# Patient Record
Sex: Female | Born: 1995 | Race: White | Hispanic: No | Marital: Single | Smoking: Never smoker
Health system: Southern US, Community
[De-identification: ages and names within clinical notes are randomized; demographics above are authoritative.]

## PROBLEM LIST (undated history)

## (undated) DIAGNOSIS — R5383 Other fatigue: Secondary | ICD-10-CM

## (undated) DIAGNOSIS — E063 Autoimmune thyroiditis: Secondary | ICD-10-CM

## (undated) DIAGNOSIS — E039 Hypothyroidism, unspecified: Secondary | ICD-10-CM

## (undated) DIAGNOSIS — H6090 Unspecified otitis externa, unspecified ear: Secondary | ICD-10-CM

## (undated) DIAGNOSIS — E0590 Thyrotoxicosis, unspecified without thyrotoxic crisis or storm: Secondary | ICD-10-CM

## (undated) DIAGNOSIS — L709 Acne, unspecified: Secondary | ICD-10-CM

## (undated) DIAGNOSIS — T7840XA Allergy, unspecified, initial encounter: Secondary | ICD-10-CM

## (undated) DIAGNOSIS — E049 Nontoxic goiter, unspecified: Secondary | ICD-10-CM

## (undated) HISTORY — DX: Autoimmune thyroiditis: E06.3

## (undated) HISTORY — DX: Unspecified otitis externa, unspecified ear: H60.90

## (undated) HISTORY — DX: Hypothyroidism, unspecified: E03.9

## (undated) HISTORY — DX: Other fatigue: R53.83

## (undated) HISTORY — PX: TONSILLECTOMY: SUR1361

## (undated) HISTORY — DX: Allergy, unspecified, initial encounter: T78.40XA

## (undated) HISTORY — PX: TYMPANOSTOMY TUBE PLACEMENT: SHX32

## (undated) HISTORY — DX: Acne, unspecified: L70.9

## (undated) HISTORY — DX: Nontoxic goiter, unspecified: E04.9

## (undated) HISTORY — DX: Thyrotoxicosis, unspecified without thyrotoxic crisis or storm: E05.90

---
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
## 2014-07-07 ENCOUNTER — Telehealth: Payer: Self-pay | Admitting: Internal Medicine

## 2014-07-07 ENCOUNTER — Telehealth: Payer: Self-pay | Admitting: Family Medicine

## 2014-07-07 ENCOUNTER — Telehealth: Payer: Self-pay | Admitting: *Deleted

## 2014-07-07 NOTE — Telephone Encounter (Signed)
Called pt's mother, Marcelino DusterMichelle, lvm to clarify the fax number.

## 2014-07-07 NOTE — Telephone Encounter (Signed)
Called pt's mother, Marcelino DusterMichelle at [phone] and advised her that Dr Elvera LennoxGherghe did not receive any recent labs. She got our fax number and will be faxing them to Dr Elvera LennoxGherghe to review.

## 2014-07-07 NOTE — Telephone Encounter (Signed)
Vickie Mata, can you please check with her Re: Qs?

## 2014-07-07 NOTE — Telephone Encounter (Signed)
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 Last seen 04/26/2013  She is seeing doctor on campus.  Mother would like to see if Dr. Fabian SharpPanosh has any comments or recommendations.  Please advise.  Thanks!

## 2014-07-07 NOTE — Telephone Encounter (Signed)
Pt's mother called back and clarified the fax number for us to send lab results to Dr Renee Harderohoon, #[phone]. She also wanted to know if Dr Elvera LennoxGherghe received any labs from pt recently and has she had the opportunity to review them if so? Please advise.

## 2014-07-07 NOTE — Telephone Encounter (Signed)
I would be happy to review any records  Last time she had high strep antibodies  But can have many causes .    if it would help then she can give release  To  Review our  records and labs related to her last visit of E nodosdum

## 2014-07-07 NOTE — Telephone Encounter (Signed)
Patient mom called, she would like to talk to you about her daughter lab orders call mom, Marcelino DusterMichelle [phone]

## 2014-07-07 NOTE — Telephone Encounter (Signed)
They requested labs to be faxed to dr at [phone]. Done.

## 2014-07-07 NOTE — Telephone Encounter (Signed)
Spoke to LynnMichelle.  She will have Rayfield CitizenCaroline stop by campus clinic to sign a DPR and will have records sent here for WP's review.  Gave her the fax # of [phone] that will come directly to me.

## 2014-07-07 NOTE — Telephone Encounter (Signed)
No, I did not receive any recent labs.Marland Kitchen..Marland Kitchen

## 2014-07-08 NOTE — Telephone Encounter (Signed)
Office note received and placed on WP's desk. 

## 2014-07-11 NOTE — Telephone Encounter (Signed)
I reviewed  The notes received . I agree with her hyper thyroid diagnosis and this could be the cause of her skin changes  Either EN or  Pretibial myxedema that can see in thyroid disease. tsh .06 free t3 7.23 and free t4 3.91  Agree with seeing  Endocrinologist  Up at school . For evaluation and treatment  Recommendations  will also  forward this to dr Elvera LennoxGherghe  Please send them our last  Labs and thryoid tests from December 2014 and on  Thanks

## 2014-07-11 NOTE — Telephone Encounter (Signed)
Patient's mother notified.  She will call back with Rayfield CitizenCaroline makes an appt with endo and we will send lab results.

## 2014-07-12 ENCOUNTER — Telehealth: Payer: Self-pay | Admitting: Internal Medicine

## 2014-07-12 ENCOUNTER — Encounter: Payer: Self-pay | Admitting: Internal Medicine

## 2014-07-12 MED ORDER — ATENOLOL 25 MG PO TABS: 25.0000 mg | ORAL_TABLET | Freq: Every day | ORAL | Status: DC

## 2014-07-12 NOTE — Telephone Encounter (Signed)
Rx completed and faxed to [phone].

## 2014-07-12 NOTE — Progress Notes (Signed)
[facility] [place] [place].  TSH 0.06 (0.27-4.2)  Free T4 3.91 (0.8-1.8)  Free T3 7.23 (2-4)  ESR 2 I think at this point we need an uptake and scan before making further decisions for treatment.   I called and discussed with her mother and the patient is trying to establish care with an endocrinologist in [place], however, the first appointment that she was given was May. The [facility]. ordered a thyroid uptake and scan in the thyroid ultrasound and the results of these are pending.  I asked the mom to give me the name of the pharmacy in [place] so we can send in a beta blocker, since the patient complains of palpitations, and not being able to sleep. She will call me back with this information. We can send atenolol 25 mg at bedtime. We might need to increase to twice a day in the future. Discussed mechanism of action and possible side effects.

## 2014-07-12 NOTE — Telephone Encounter (Signed)
Below is the phone number and fax number for the [facility]. Please advise if I need to order rx for atenolol.

## 2014-07-12 NOTE — Telephone Encounter (Signed)
Yes, please, per my last message

## 2014-07-12 NOTE — Telephone Encounter (Signed)
[facility] # [phone] fax # [phone]  If there are any questions mom's cell # is [phone]

## 2014-07-18 ENCOUNTER — Encounter: Payer: Self-pay | Admitting: Internal Medicine

## 2014-07-18 DIAGNOSIS — E063 Autoimmune thyroiditis: Secondary | ICD-10-CM

## 2014-07-18 NOTE — Progress Notes (Signed)
[facility] [place] from 07[place]12/2014: - TPO antibodies >1000 (0-35) - ATA 65.9 (0-40)  Also received the results of her thyroid ultrasound from 07/14/2014:  Right lobe: Volume 12.1 mL. Increased vascularity throughout the right thyroid lobe. There is a heterogeneous appearance to the parenchyma diffusely. No definite focal nodules identified.  Left lobe: The left lobe is also diffusely heterogeneous. There is a lesion along the inferior aspect of the left thyroid lobe measuring 1.2 x 0.7 x 0.9 cm. This does not definitely contain internal blood flow. It may represent a nodule within the gland, although, it could also represent a parathyroid lesion. Left thyroid lobe volume is 11.2 mL. The left thyroid lobe is also hypervascular.  Isthmus: Thickness of 0.64 cm. No nodules. Impression: 1. Enlarged, heterogeneous, and hypervascular thyroid. This could reflect thyroiditis related to patient's known Hashimoto's disease. 2. Possible left thyroid nodule as discussed above. This could potentially be just outside the thyroid and represent a parathyroid lesion. Please correlate clinically.

## 2014-07-26 ENCOUNTER — Telehealth: Payer: Self-pay | Admitting: Internal Medicine

## 2014-07-26 NOTE — Telephone Encounter (Signed)
MOTHER WANTS TO KNOW THE RESULTS OF UPTAKE SCAN

## 2014-07-26 NOTE — Telephone Encounter (Signed)
Please read message below and advise.  

## 2014-07-26 NOTE — Telephone Encounter (Signed)
Yes, me too. I only got the result of the ultrasound >> that shows increased vascularity, but not uptake an scan yet.Marland Kitchen..Marland Kitchen

## 2014-07-27 ENCOUNTER — Encounter: Payer: Self-pay | Admitting: Internal Medicine

## 2014-07-27 NOTE — Progress Notes (Signed)
Received results of the thyroid uptake and [facility] [place]:  The 4 and 24-hour uptake values are mildly elevated at 37 and 49%, respectively. The findings would be consistent with hyperthyroidism. The right and left lobes of the thyroid are upper normal in size. No discrete nodules are seen.  The normal range is 10-30%.

## 2014-08-05 ENCOUNTER — Encounter: Payer: Self-pay | Admitting: *Deleted

## 2014-10-07 ENCOUNTER — Telehealth: Payer: Self-pay | Admitting: Internal Medicine

## 2014-10-07 IMAGING — CR DG CHEST 2V
2 series · 2 of 2 positions shown · non-contrast
Comparison: None

CLINICAL DATA: Sore throat for 4 days, history asthma

EXAM:
CHEST  2 VIEW

[view not recorded (1 of 2)]
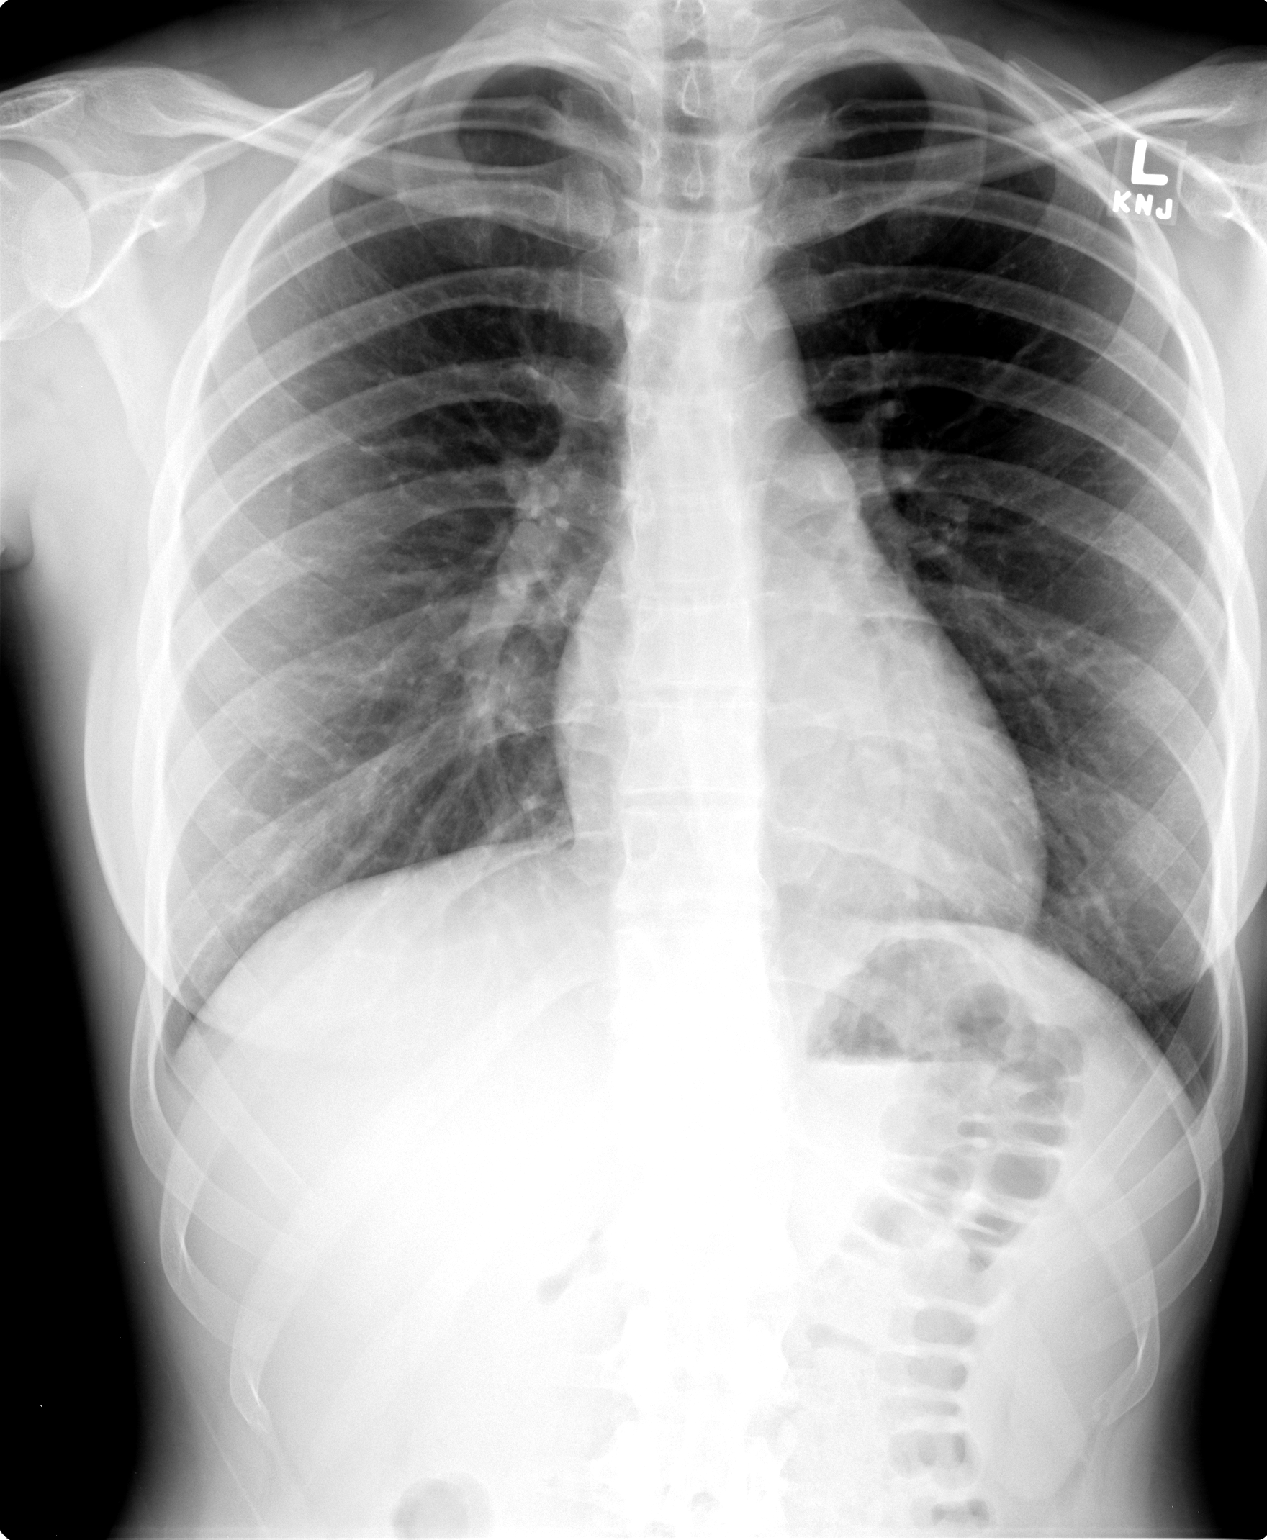

[view not recorded (2 of 2)]
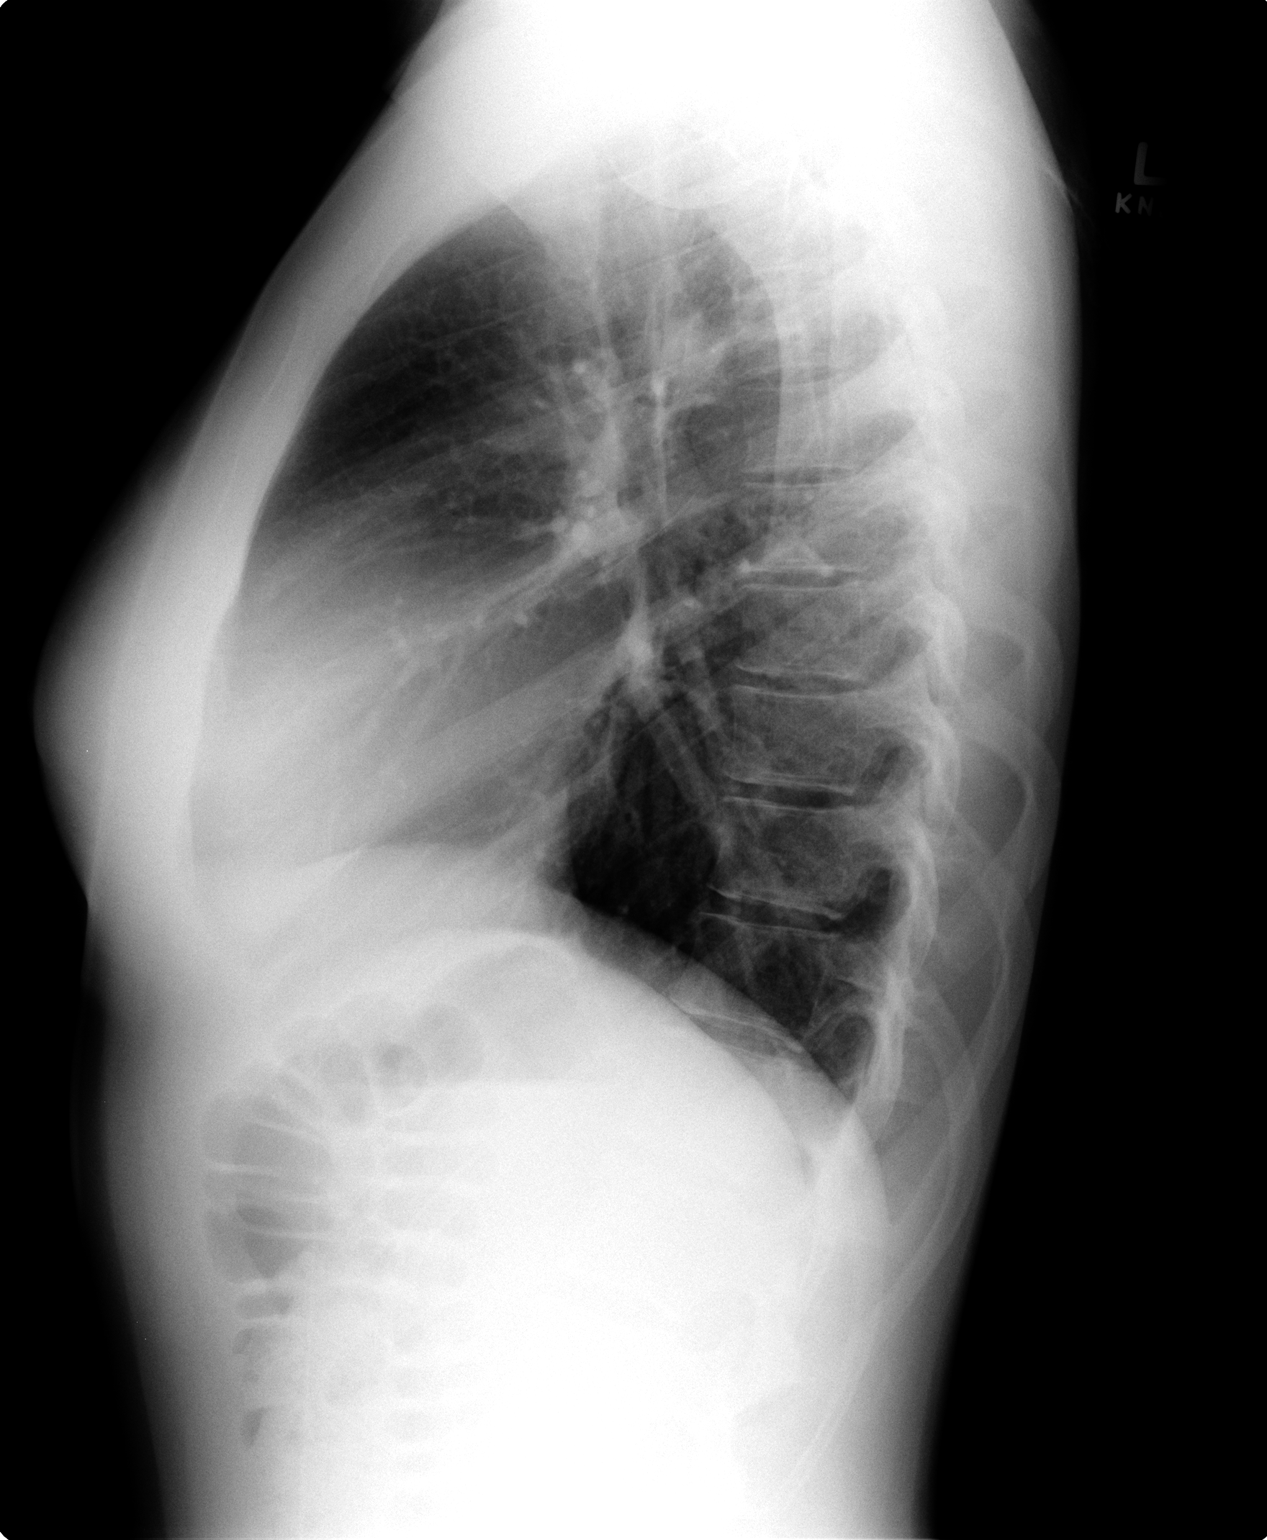

[2 of 2 positions shown; findings below may reference images not displayed]

FINDINGS: Normal heart size, mediastinal contours, and pulmonary vascularity.

Lungs clear.

Bones unremarkable.

No pneumothorax.
IMPRESSION: Normal exam.

## 2014-10-07 NOTE — Telephone Encounter (Signed)
Left a message informing her that I am leaving for the day.  Was not able to tell her that [facility] for her.

## 2014-10-07 NOTE — Telephone Encounter (Signed)
LM for the pt to return my call. 

## 2014-10-07 NOTE — Telephone Encounter (Signed)
[place] traveling to [place] on Monday (for school) and lost her prescription strength anti- diarrhea med. Would like to know if dr Fabian Sharppanosh would send her in another one for the trip. [facility]  Pt not seen since 04/26/2013 Pt also asked if she would refill her Norgestimate-Ethinyl Estradiol Triphasic 0.18/0.215/0.25 MG-35 MCG tablet   Advised pt probably not since she had not been seen, but would ask.

## 2014-10-25 ENCOUNTER — Ambulatory Visit (INDEPENDENT_AMBULATORY_CARE_PROVIDER_SITE_OTHER): Payer: BLUE CROSS/BLUE SHIELD | Admitting: Internal Medicine

## 2014-10-25 ENCOUNTER — Encounter: Payer: Self-pay | Admitting: Internal Medicine

## 2014-10-25 VITALS — BP 118/80 | Temp 98.6°F | Wt 157.0 lb

## 2014-10-25 DIAGNOSIS — Z3041 Encounter for surveillance of contraceptive pills: Secondary | ICD-10-CM

## 2014-10-25 DIAGNOSIS — L52 Erythema nodosum: Secondary | ICD-10-CM

## 2014-10-25 DIAGNOSIS — Z79899 Other long term (current) drug therapy: Secondary | ICD-10-CM | POA: Diagnosis not present

## 2014-10-25 DIAGNOSIS — E063 Autoimmune thyroiditis: Secondary | ICD-10-CM | POA: Diagnosis not present

## 2014-10-25 MED ORDER — NORGESTIM-ETH ESTRAD TRIPHASIC 0.18/0.215/0.25 MG-35 MCG PO TABS: 1.0000 | ORAL_TABLET | Freq: Every day | ORAL | Status: DC

## 2014-10-25 NOTE — Patient Instructions (Signed)
Sounds like erythema nodosum . Take pictures and check with derm also. Many causes  possible  Uncertain cause  OCPS thyroid could do this.  Fu with dermatologist  And  Dr Elvera LennoxGherghe about thryoid  .

## 2014-10-25 NOTE — Progress Notes (Signed)
Pre visit review using our clinic review tool, if applicable. No additional management support is needed unless otherwise documented below in the visit note.  Chief Complaint  Patient presents with  . Painful lumps on left shin    fill ocps ? tsh    HPI: Patient Vickie Mata  comes in today for SDA for  new problem evaluation. 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 Last seen dec 2014 ... Comes in to check  About nodules recurring in her lower extremities over the shins. See past notes and if can  rx meds and check thyroid .. 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  [place] in [place]  This summer .hard to get to  appts m- fri.  So comes today  Recently had some nodules left lower extremity not discolored at this point and not particularly painful but asks about them again. No fevers or arthritis. Has been on OCPs from her dermatologist but can't get to appointment until the end of the summer can we refill her medicine no side effects noted no history of blood clots.  She is also due for yearly check with endocrinology because of her autoimmune thyroiditis last TSH was about a year ago. She is not yet on the schedule for the endocrinologist. ROS: See pertinent positives and negatives per HPI. No cp sob  Just returned from 12 days [place]  No exposures . No new meds  Out of ocps for 2 weeks  Generally feels well no unusual weight loss sweats fevers new rashes otherwise. Her shortness of breath chest pain.  Past Medical History  Diagnosis Date  . Allergy   . Acne   . Hashimoto's thyroiditis   . Asthma   . Hyperthyroidism   . Hypothyroidism   . Goiter   . Fatigue   . Otitis externa 12/09/2011    Family History  Problem Relation Age of Onset  . Thyroid disease Mother     Borderline TFTs  . Diabetes Maternal Grandfather   . Diabetes Paternal Grandfather   . Cancer Neg Hx   . Heart disease Neg Hx   . Stroke Neg Hx      History   Social History  . Marital Status: Single    Spouse Name: N/A  . Number of Children: N/A  . Years of Education: N/A   Social History Main Topics  . Smoking status: Never Smoker   . Smokeless tobacco: Not on file  . Alcohol Use: No  . Drug Use: No  . Sexual Activity: Not on file   Other Topics Concern  . None   Social History Narrative   HH of 6 no ETS   Swims   [employer] and track   [place] originally    [place]   To attend [employer] in the fall    Outpatient Prescriptions Prior to Visit  Medication Sig Dispense Refill  . Norgestimate-Ethinyl Estradiol Triphasic 0.18/0.215/0.25 MG-35 MCG tablet Take 1 tablet by mouth daily.    Marland Kitchen atenolol (TENORMIN) 25 MG tablet Take 1 tablet (25 mg total) by mouth at bedtime. (Patient not taking: Reported on 10/25/2014) 90 tablet 1   No facility-administered medications prior to visit.     EXAM:  BP 118/80 mmHg  Temp(Src) 98.6 F (37 C) (Oral)  Wt 157 lb (71.215 kg)  Body mass index is 23.02 kg/(m^2). Repeat bp 118/80 GENERAL: vitals reviewed and listed above, alert,  oriented, appears well hydrated and in no acute distress HEENT: atraumatic, conjunctiva  clear, no obvious abnormalities on inspection of external nose and ears  NECK: no obvious masses on inspection palpation  MS: moves all extremities without noticeable focal  Abnormality Skin palpable nodules 2  anterior shin left non tender  An no coloration  About 2 cm no other acute finding   PSYCH: pleasant and cooperative, no obvious depression or anxiety  ASSESSMENT AND PLAN:  Discussed the following assessment and plan:  Erythema nodosum - prob by hx and description not acute at this time needs thyroid fu .   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 12 14  She's had thyroid disease at that time. Apparently has had flareups in the past year once or twice also. Discussed differential diagnosis no alarm features today if flareup of her leg symptoms we can have her dermatologist see her recheck here. -Patient advised to return or notify health care team  if symptoms worsen ,persist or new concerns arise.  Patient Instructions  Sounds like erythema nodosum . Take pictures and check with derm also. Many causes  possible  Uncertain cause  OCPS thyroid could do this.  Fu with dermatologist  And  Dr Elvera LennoxGherghe about thryoid  .      Neta MendsWanda K. Panosh M.D.

## 2015-01-11 ENCOUNTER — Telehealth: Payer: Self-pay | Admitting: Internal Medicine

## 2015-01-11 NOTE — Telephone Encounter (Signed)
Patient asked if her lab order can be sent to [facility] office, phone # [phone]  Fax # [phone]

## 2015-01-12 ENCOUNTER — Telehealth: Payer: Self-pay | Admitting: Internal Medicine

## 2015-01-12 ENCOUNTER — Other Ambulatory Visit: Payer: Self-pay | Admitting: *Deleted

## 2015-01-12 DIAGNOSIS — E063 Autoimmune thyroiditis: Secondary | ICD-10-CM

## 2015-01-12 NOTE — Telephone Encounter (Signed)
Pt would like to have labs drawn ordered by dr Lafe Garin for tsh.. Pt has hashimosto thyroiditis. Pt mom said its standing order. Pt last had labs [facility] at [facility]. Pt also needs refill on norgestimate bcp [facility].

## 2015-01-12 NOTE — Telephone Encounter (Signed)
Need to know exact lab tests? Only the tsh or tsh and free t4?  and diagnosis code per dr Reece Agar .   Then ok to do this   Ok to refill ocps  For 6 months      wellness visit or OV    Before runs out 4- 6 months

## 2015-01-13 NOTE — Telephone Encounter (Signed)
Left a message for a return call.

## 2015-01-17 NOTE — Telephone Encounter (Signed)
LM for a return call.  

## 2015-01-18 NOTE — Telephone Encounter (Signed)
Labs are in for pt.

## 2015-01-24 ENCOUNTER — Telehealth: Payer: Self-pay | Admitting: Family Medicine

## 2015-01-24 ENCOUNTER — Other Ambulatory Visit: Payer: Self-pay | Admitting: Family Medicine

## 2015-01-24 DIAGNOSIS — Z0000 Encounter for general adult medical examination without abnormal findings: Secondary | ICD-10-CM

## 2015-01-24 MED ORDER — NORGESTIM-ETH ESTRAD TRIPHASIC 0.18/0.215/0.25 MG-35 MCG PO TABS: 1.0000 | ORAL_TABLET | Freq: Every day | ORAL | Status: DC

## 2015-01-24 NOTE — Telephone Encounter (Signed)
Pt needs yearly lab work and cpx in the next 4-6 months.  Please help her to make both appointments.  Thanks! I have placed the lab work orders.

## 2015-01-24 NOTE — Telephone Encounter (Signed)
Left a message for a return call.  Have made multiple attempts.  Will now close the note. Will send a message to scheduling to help the pt make her wellness visit.

## 2015-01-25 NOTE — Telephone Encounter (Signed)
lmom for pt to call back

## 2015-01-27 NOTE — Telephone Encounter (Signed)
lmom for pt to callbacl

## 2015-02-06 NOTE — Telephone Encounter (Signed)
lmom for pt to call back

## 2015-06-07 ENCOUNTER — Encounter: Payer: Self-pay | Admitting: Internal Medicine

## 2015-06-07 ENCOUNTER — Ambulatory Visit (INDEPENDENT_AMBULATORY_CARE_PROVIDER_SITE_OTHER): Payer: BLUE CROSS/BLUE SHIELD | Admitting: Internal Medicine

## 2015-06-07 VITALS — BP 120/70 | Temp 97.5°F | Wt 168.3 lb

## 2015-06-07 DIAGNOSIS — L700 Acne vulgaris: Secondary | ICD-10-CM

## 2015-06-07 DIAGNOSIS — L52 Erythema nodosum: Secondary | ICD-10-CM | POA: Diagnosis not present

## 2015-06-07 DIAGNOSIS — N946 Dysmenorrhea, unspecified: Secondary | ICD-10-CM

## 2015-06-07 DIAGNOSIS — Z299 Encounter for prophylactic measures, unspecified: Secondary | ICD-10-CM | POA: Diagnosis not present

## 2015-06-07 DIAGNOSIS — E063 Autoimmune thyroiditis: Secondary | ICD-10-CM | POA: Diagnosis not present

## 2015-06-07 DIAGNOSIS — Z3041 Encounter for surveillance of contraceptive pills: Secondary | ICD-10-CM

## 2015-06-07 DIAGNOSIS — Z79899 Other long term (current) drug therapy: Secondary | ICD-10-CM

## 2015-06-07 DIAGNOSIS — Z23 Encounter for immunization: Secondary | ICD-10-CM | POA: Diagnosis not present

## 2015-06-07 LAB — CBC WITH DIFFERENTIAL/PLATELET
BASOS ABS: 0 10*3/uL (ref 0.0–0.1)
BASOS PCT: 0.5 % (ref 0.0–3.0)
EOS ABS: 0.1 10*3/uL (ref 0.0–0.7)
Eosinophils Relative: 1.8 % (ref 0.0–5.0)
HEMATOCRIT: 39 % (ref 36.0–46.0)
Hemoglobin: 13.1 g/dL (ref 12.0–15.0)
LYMPHS ABS: 2.6 10*3/uL (ref 0.7–4.0)
LYMPHS PCT: 54.1 % — AB (ref 12.0–46.0)
MCHC: 33.7 g/dL (ref 30.0–36.0)
MCV: 86.2 fl (ref 78.0–100.0)
MONO ABS: 0.4 10*3/uL (ref 0.1–1.0)
Monocytes Relative: 8.4 % (ref 3.0–12.0)
NEUTROS ABS: 1.7 10*3/uL (ref 1.4–7.7)
NEUTROS PCT: 35.2 % — AB (ref 43.0–77.0)
PLATELETS: 219 10*3/uL (ref 150.0–400.0)
RBC: 4.53 Mil/uL (ref 3.87–5.11)
RDW: 12.1 % (ref 11.5–14.6)
WBC: 4.9 10*3/uL (ref 4.5–10.5)

## 2015-06-07 LAB — LIPID PANEL
CHOL/HDL RATIO: 2
Cholesterol: 157 mg/dL (ref 0–200)
HDL: 78.8 mg/dL (ref >39.00)
LDL CALC: 68 mg/dL (ref 0–99)
NONHDL: 77.86
TRIGLYCERIDES: 49 mg/dL (ref 0.0–149.0)
VLDL: 9.8 mg/dL (ref 0.0–40.0)

## 2015-06-07 LAB — BASIC METABOLIC PANEL
BUN: 14 mg/dL (ref 6–23)
CALCIUM: 9.4 mg/dL (ref 8.4–10.5)
CO2: 28 meq/L (ref 19–32)
CREATININE: 0.76 mg/dL (ref 0.40–1.20)
Chloride: 105 mEq/L (ref 96–112)
GFR: 102.13 mL/min (ref >60.00)
Glucose, Bld: 99 mg/dL (ref 70–99)
Potassium: 4 mEq/L (ref 3.5–5.1)
Sodium: 139 mEq/L (ref 135–145)

## 2015-06-07 LAB — HEPATIC FUNCTION PANEL
ALK PHOS: 58 U/L (ref 39–117)
ALT: 40 U/L — ABNORMAL HIGH (ref 0–35)
AST: 28 U/L (ref 0–37)
Albumin: 4.3 g/dL (ref 3.5–5.2)
BILIRUBIN DIRECT: 0.1 mg/dL (ref 0.0–0.3)
BILIRUBIN TOTAL: 0.4 mg/dL (ref 0.2–1.2)
Total Protein: 6.5 g/dL (ref 6.0–8.3)

## 2015-06-07 LAB — T4, FREE: FREE T4: 1.9 ng/dL — AB (ref 0.60–1.60)

## 2015-06-07 LAB — TSH: TSH: 1.1 u[IU]/mL (ref 0.35–5.50)

## 2015-06-07 LAB — T3, FREE: T3, Free: 5.2 pg/mL — ABNORMAL HIGH (ref 2.3–4.2)

## 2015-06-07 LAB — C-REACTIVE PROTEIN: CRP: 0.1 mg/dL — ABNORMAL LOW (ref 0.5–20.0)

## 2015-06-07 MED ORDER — NORGESTIMATE-ETH ESTRADIOL 0.25-35 MG-MCG PO TABS: 1.0000 | ORAL_TABLET | Freq: Every day | ORAL | Status: DC

## 2015-06-07 MED ORDER — NORGESTIM-ETH ESTRAD TRIPHASIC 0.18/0.215/0.25 MG-35 MCG PO TABS: 1.0000 | ORAL_TABLET | Freq: Every day | ORAL | Status: DC

## 2015-06-07 NOTE — Patient Instructions (Addendum)
Ok to stay on ocps  Uncertain if could be adding to the leg rash nodules .  Can change to monophasic ocp .  Suggest see dermatology of  continueing issues with rash . There are other things to check if ongoing  Glad you are doing well. Will send results of tetsing to dr Elvera LennoxGherghe.   rov in 6-12 months depending on how you are doing

## 2015-06-07 NOTE — Progress Notes (Signed)
Pre visit review using our clinic review tool, if applicable. No additional management support is needed unless otherwise documented below in the visit note.  Chief Complaint  Patient presents with  . Follow-up    ocps  due for thyoid labs    HPI Vickie Mata    comesin today  Follow-up for medicine evaluation. [place] got [place] from [place] and feels well. She is going back to [place] to start her next [place].  Taking ocps      After each month bleed about 5 day s. Has really helped her acne. But also it helped her periods and cramps In have an opportunity did see dermatology because   But in school   .   bp ok sleep ok .  Old rash  possible E nodosum on her legs is better but occasionally gets 1. Seem to have been related when she got very sick. Due for blood work. From Vickie Mata  endocrinology but she has to go back to school. Asked if we can do the blood work and send it to her. ROS: See pertinent positives and negatives per HPI.  Past Medical History  Diagnosis Date  . Allergy   . Acne   . Hashimoto's thyroiditis   . Asthma   . Hyperthyroidism   . Hypothyroidism   . Goiter   . Fatigue   . Otitis externa 12/09/2011    Family History  Problem Relation Age of Onset  . Thyroid disease Mother     Borderline TFTs  . Diabetes Maternal Grandfather   . Diabetes Paternal Grandfather   . Cancer Neg Hx   . Heart disease Neg Hx   . Stroke Neg Hx     Social History   Social History  . Marital Status: Single    Spouse Name: N/A  . Number of Children: N/A  . Years of Education: N/A   Social History Main Topics  . Smoking status: Never Smoker   . Smokeless tobacco: None  . Alcohol Use: No  . Drug Use: No  . Sexual Activity: Not Asked   Other Topics Concern  . None   Social History Narrative   HH of 6 no ETS   Swims   [employer] and [employer]   [place] originally    [employer]   To attend [employer] in the fall     Outpatient Prescriptions Prior to Visit  Medication Sig Dispense Refill  . Norgestimate-Ethinyl Estradiol Triphasic 0.18/0.215/0.25 MG-35 MCG tablet Take 1 tablet by mouth daily. 1 Package 5  . atenolol (TENORMIN) 25 MG tablet Take 1 tablet (25 mg total) by mouth at bedtime. (Patient not taking: Reported on 10/25/2014) 90 tablet 1   No facility-administered medications prior to visit.     EXAM:  BP 120/70 mmHg  Temp(Src) 97.5 F (36.4 C) (Oral)  Wt 168 lb 4.8 oz (76.34 kg)  Body mass index is 24.67 kg/(m^2).  GENERAL: vitals reviewed and listed above, alert, oriented, appears well hydrated and in no acute distress HEENT: atraumatic, conjunctiva  clear, no obvious abnormalities on inspection of external nose and ears NECK: no obvious masses on inspection palpation thyroid palpable CV: HRRR, no clubbing cyanosis or  peripheral edema nl cap refill  MS: moves all extremities without noticeable focal  Abnormality Skin minimal acne on face few papules but no inflammatory lesions. Anterior shins show normal color but there is some palpable nodule 2 on  Nontender. PSYCH: pleasant  and cooperative, no obvious depression or anxiety  ASSESSMENT AND PLAN:  Discussed the following assessment and plan:  Acne vulgaris  Chronic lymphocytic thyroiditis - Plan: Basic metabolic panel, CBC with Differential/Platelet, Hepatic function panel, Lipid panel, TSH, T4, free, T3, free, Thyroid antibodies, C-reactive protein  Dysmenorrhea  Need for prophylactic vaccination and inoculation against influenza - Plan: Flu Vaccine QUAD 36+ mos PF IM (Fluarix & Fluzone Quad PF)  Erythema nodosum   ?  - Plan: Basic metabolic panel, CBC with Differential/Platelet, Hepatic function panel, Lipid panel, TSH, T4, free, T3, free, Thyroid antibodies, C-reactive protein  Preventive measure - Plan: Basic metabolic panel, CBC with Differential/Platelet, Hepatic function panel, Lipid panel, TSH  Oral contraceptive  pill surveillance  Medication management Patient asks if we can get a monophasic pill that she was on when she was in [place] as opposed to the triphasic pill printed prescription for Ortho-Cyclen given not exactly the same she can try this instead. Still would have her get with her dermatologist consideration of changing medication if she really has a diagnosis of a nodosum. Check labs today for diagnosis of lymphocytic thyroiditis. We'll get endocrinologist input on results. Her schedules a bit hampered by her school and she was out of the country for the last semester. -Patient advised to return or notify health care team  if symptoms worsen ,persist or new concerns arise.  Patient Instructions  Ok to stay on ocps  Uncertain if could be adding to the leg rash nodules .  Can change to monophasic ocp .  Suggest see dermatology of  continueing issues with rash . There are other things to check if ongoing  Glad you are doing well. Will send results of tetsing to dr Elvera Lennox.   rov in 6-12 months depending on how you are doing  Vickie Mata. Panosh M.D.

## 2015-06-08 LAB — THYROID ANTIBODIES
THYROGLOBULIN AB: 2 [IU]/mL — AB (ref <2)
THYROID PEROXIDASE ANTIBODY: 117 [IU]/mL — AB (ref <9)

## 2015-07-19 ENCOUNTER — Telehealth: Payer: Self-pay | Admitting: Internal Medicine

## 2015-07-19 NOTE — Telephone Encounter (Signed)
Pt called regarding some lab results she was waiting to hear back from.

## 2015-07-20 NOTE — Telephone Encounter (Signed)
Called pt and lvm advising her per Dr Charlean Sanfilippo message. Advised pt thyroid levels are stable.

## 2015-11-07 ENCOUNTER — Telehealth: Payer: Self-pay | Admitting: Internal Medicine

## 2015-11-09 NOTE — Telephone Encounter (Signed)
Error

## 2016-01-11 ENCOUNTER — Ambulatory Visit (INDEPENDENT_AMBULATORY_CARE_PROVIDER_SITE_OTHER): Payer: BLUE CROSS/BLUE SHIELD | Admitting: Internal Medicine

## 2016-01-11 ENCOUNTER — Telehealth: Payer: Self-pay | Admitting: Internal Medicine

## 2016-01-11 ENCOUNTER — Encounter: Payer: Self-pay | Admitting: Internal Medicine

## 2016-01-11 VITALS — BP 122/82 | HR 80 | Wt 163.0 lb

## 2016-01-11 DIAGNOSIS — R938 Abnormal findings on diagnostic imaging of other specified body structures: Secondary | ICD-10-CM

## 2016-01-11 DIAGNOSIS — E0789 Other specified disorders of thyroid: Secondary | ICD-10-CM

## 2016-01-11 DIAGNOSIS — R9389 Abnormal findings on diagnostic imaging of other specified body structures: Secondary | ICD-10-CM

## 2016-01-11 NOTE — Telephone Encounter (Signed)
ERROR

## 2016-01-11 NOTE — Progress Notes (Addendum)
Patient ID: Vickie Mata, female   DOB: [birth year], 20 y.o.   MRN: [id number]   HPI  Vickie Mata is a 20 y.o.-year-old female, initially referred by her PCP, Dr. Regis Bill, for management of Hashimoto's thyroiditis. She was followed Dr Tobe Sos. Last visit with me 2 years ago.  Pt. has been dx with Hashimoto's thyroiditis in 2012; not on LT4.    I reviewed pt's thyroid tests - TSH levels have always been normal, however the free T4 and free T3 have been always elevated: Lab Results  Component Value Date   TSH 1.10 06/07/2015   TSH 2.334 10/13/2013   TSH 1.00 04/26/2013   TSH 1.555 10/07/2012   TSH 1.783 05/11/2012   TSH 1.294 04/06/2012   TSH 1.902 09/11/2011   TSH 2.892 12/28/2010   TSH 1.07 04/14/2009   FREET4 1.90 (H) 06/07/2015   FREET4 1.98 (H) 10/13/2013   FREET4 1.79 (H) 04/26/2013   FREET4 1.92 (H) 10/07/2012   FREET4 2.41 (H) 05/11/2012   FREET4 2.12 (H) 04/06/2012   FREET4 2.36 (H) 09/11/2011   FREET4 1.91 (H) 12/28/2010   FREET4 1.9 (H) 04/14/2009    Reviewed TPO antibody level: Component     Latest Ref Rng & Units 10/14/2013 06/07/2015  Thyroperoxidase Ab SerPl-aCnc     <9 IU/mL 87.8 (H) 117 (H)  Thyroglobulin Ab     <2 IU/mL  2 (H)   Prev: 2863.6 (H) <35   Reviewed previous documentations. 07/12/2014: [facility] [place] [place]  TSH 0.06 (0.27-4.2)  Free T4 3.91 (0.8-1.8)  Free T3 7.23 (2-4)  ESR 2  We started a beta blocker, since the patient complained of palpitations, and not being able to sleep - atenolol 25 mg at bedtime. She did not start this.  Received labs from [facility] in [place], [place], from 07/12/2014: - TPO antibodies >1000 (0-35) - ATA 65.9 (0-40)  Also received the results of her thyroid ultrasound from 07/14/2014:  Right lobe: Volume 12.1 mL. Increased vascularity throughout the right thyroid lobe. There is a heterogeneous appearance to the parenchyma diffusely. No definite focal  nodules identified.  Left lobe: The left lobe is also diffusely heterogeneous. There is a lesion along the inferior aspect of the left thyroid lobe measuring 1.2 x 0.7 x 0.9 cm. This does not definitely contain internal blood flow. It may represent a nodule within the gland, although, it could also represent a parathyroid lesion. Left thyroid lobe volume is 11.2 mL. The left thyroid lobe is also hypervascular.  Isthmus: Thickness of 0.64 cm. No nodules. Impression: 1. Enlarged, heterogeneous, and hypervascular thyroid. This could reflect thyroiditis related to patient's known Hashimoto's disease. 2. Possible left thyroid nodule as discussed above. This could potentially be just outside the thyroid and represent a parathyroid lesion. Please correlate clinically. Pt denies feeling nodules in neck, but feels the thyroid occasionally enlarging and causing pressure, no hoarseness, no dysphagia/odynophagia, no SOB with lying down.  Received results of the thyroid uptake and scan from 07/25/2014 from [facility] in [place]:  The 4 and 24-hour uptake values are mildly elevated at 37 and 49%, respectively. The findings would be consistent with hyperthyroidism. The right and left lobes of the thyroid are upper normal in size. No discrete nodules are seen.  The normal range is 10-30%.  Pt describes: - no cold intolerance - no weight gain/loss - + fatigue - no constipation - no dry skin - no hair falling -  no depression/no anxiety - normal menses  She has no FH of thyroid disorders. Mother has a slightly elevated TSH level, but still in the normal range. No FH of thyroid cancer.  No h/o radiation tx to head or neck. No recent use of iodine supplements. No steroids, no herbal supplements.   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   Exercises consistently daily, this summer she was training for a half marathon.  ROS: Constitutional:  see HPI Eyes: no blurry vision, no xerophthalmia ENT: no sore throat, + nodules palpated in throat, no dysphagia/odynophagia, no hoarseness Cardiovascular: no CP/SOB/palpitations/leg swelling Respiratory: no cough/SOB Gastrointestinal: no N/V/D/C/+ heartburn Musculoskeletal: no muscle/joint aches Skin: no rashes Neurological: no tremors/numbness/tingling/dizziness Psychiatric: no depression/anxiety  I reviewed pt's medications, allergies, PMH, social hx, family hx, and changes were documented in the history of present illness. Otherwise, unchanged from my initial visit note. Past Medical History:  Diagnosis Date  . Acne   . Allergy   . Asthma   . Fatigue   . Goiter   . Hashimoto's thyroiditis   . Hyperthyroidism   . Hypothyroidism   . Otitis externa 12/09/2011   Past Surgical History:  Procedure Laterality Date  . FRACTURE SURGERY  2008   growth plate left wrist   . TONSILLECTOMY    . TYMPANOSTOMY TUBE PLACEMENT     History   Social History  . Marital Status: Single    Spouse Name: N/A    Number of Children: 0   Social History Main Topics  . Smoking status: Never Smoker   . Smokeless tobacco: Not on file  . Alcohol Use: Beer, wine 1-2x a week  . Drug Use: No   Social History Narrative   HH of 6 no ETS   Swims   [employer] and track   [place] originally    [employer]   To attend [employer] in the fall   Current Outpatient Prescriptions on File Prior to Visit  Medication Sig Dispense Refill  . norgestimate-ethinyl estradiol (ORTHO-CYCLEN,SPRINTEC,PREVIFEM) 0.25-35 MG-MCG tablet Take 1 tablet by mouth daily. 1 Package 11  . Norgestimate-Ethinyl Estradiol Triphasic 0.18/0.215/0.25 MG-35 MCG tablet Take 1 tablet by mouth daily. 1 Package 11   No current facility-administered medications on file prior to visit.    No Known Allergies Family History  Problem Relation Age of Onset  . Thyroid disease Mother     Borderline TFTs  . Diabetes Maternal  Grandfather   . Diabetes Paternal Grandfather   . Cancer Neg Hx   . Heart disease Neg Hx   . Stroke Neg Hx    PE: BP 122/82 (BP Location: Left Arm, Patient Position: Sitting)   Pulse 80   Wt 163 lb (73.9 kg)   LMP 12/25/2015   SpO2 97%   BMI 23.90 kg/m  Wt Readings from Last 3 Encounters:  01/11/16 163 lb (73.9 kg)  06/07/15 168 lb 4.8 oz (76.3 kg)  10/25/14 157 lb (71.2 kg)   Constitutional: normal weight, fit, in NAD Eyes: PERRLA, EOMI, no exophthalmos ENT: moist mucous membranes, + thyromegaly, no cervical lymphadenopathy Cardiovascular: RRR, No MRG Respiratory: CTA B Gastrointestinal: abdomen soft, NT, ND, BS+ Musculoskeletal: no deformities, strength intact in all 4 Skin: moist, warm, no rashes Neurological: no tremor with outstretched hands, DTR normal in all 4  ASSESSMENT: 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 L Thyroid/parathyroid nodule  3. Hashimoto's thyroiditis  PLAN:  1. 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 She is essentially asymptomatic, but does have a goiter. She had palpitations, now resolved. No known family history of similar pattern of thyroid tests. A thyroid uptake and scan was consistent with uniform increased uptake. An alpha subunit was low, making the diagnosis of pituitary TSH secreting adenoma much less likely. - Today, we will check again the alpha subunit, along with TSH, free T4 and free T3 by equilibrium dialysis, TSI, IGF-I, growth hormone, prolactin (to rule out possible pituitary TSH-oma co-secreting GH or prolactin). An elevated SHBG could be pointing towards resistance to thyroid hormones, however, she is on OCPs, so I expect itto be elevated. Conversely, a normal SHBG could be meaningful, to make [place] less likely. - I will see the  patient back in  4 months, when she comes back from [place]. If we need to check for TR beta mutation, we can do that then. If labs point towards a pituitary problem, I would like to check a pituitary MRI - in December or sooner.  2. Thyroid/parathyroid nodule - Pt has a goiter but had a nodule on the thyroid ultrasound from 06/2014. This nodule was considered to be either in the thyroid or a possible parathyroid mass. Patient did not have follow-up for this, since she only returned to see me now. The nodule was small, but I would like to repeat the thyroid ultrasound, now 1.5 years later to see if there are any changes and to see if we can further differentiate between a thyroid/parathyroid mass. She prefers to have this done in [place] >> advised her to have radiology send me the report + images. - no neck compression symptoms - We'll also check a PTH + calcium level  3. Hasimoto's thyroiditis - not on levothyroxine therapy - She appears euthyroid - check TFTs today  Orders Placed This Encounter  Procedures  . TSH  . Prolactin  . Insulin-like growth factor  . Growth hormone  . PTH, intact and calcium  . Thyroid Stimulating Immunoglobulin  . Alpha Subunit (Free)  . Direct Dialysis Free T4  . Free T3 by Dialysis with T3 Total  . Sex hormone binding globulin   I will addend the results when they become available.  - time spent with the patient: 40 minutes, of which >50% was spent in obtaining information about her symptoms, reviewing her previous labs, evaluations, and treatments, counseling her about her condition (please see the discussed topics above), and developing a plan to further investigate it; she had a number of questions which I addressed.  Component     Latest Ref Rng & Units 01/11/2016  IGF-I, LC/MS     83 - 456 ng/mL 193  Z-Score (Female)     -2.0 - 2.0 SD -0.4  PTH     14 - 64 pg/mL 29  Calcium     8.4 - 10.5 mg/dL 9.4  Free T3     210 - 440 pg/dL 291  T3,  Total     76 - 181 ng/dL 151  TSH     0.35 - 4.50 uIU/mL 1.00  Alpha Subunit (Free)     ng/mL 0.21  Prolactin     ng/mL 7.1  Growth Hormone     <=7.1 ng/mL 1.5  TSI     <140 % baseline <89  T4, Free Direct Dialysis     0.8 - 2.7 ng/dL 2.6  Sex Horm Binding Glob, Serum     17 - 124 nmol/L 216 (H)   TSH is normal; free T4 and free T3 checked by equilibrium dialysis are also normal. In this case, we need to change the diagnosis from resistance to thyroid hormones back to Hashimoto's thyroiditis, which is euthyroid, and her previous elevated free hormones appear to have been from assay interference.  PTH, calcium, and the rest of the pituitary labs are normal. SHBG is elevated, however, this is most likely due to her OCP use.  Philemon Kingdom, MD PhD [facility]

## 2016-01-11 NOTE — Patient Instructions (Signed)
Please schedule a new thyroid U/S and have the Radiology Dept.  send me the CD with the images.  Please stop at the lab.  Please come back for a follow-up appointment in 4 months.

## 2016-01-12 LAB — PROLACTIN: PROLACTIN: 7.1 ng/mL

## 2016-01-12 LAB — PTH, INTACT AND CALCIUM
Calcium: 9.4 mg/dL (ref 8.4–10.5)
PTH: 29 pg/mL (ref 14–64)

## 2016-01-12 LAB — TSH: TSH: 1 u[IU]/mL (ref 0.35–4.50)

## 2016-01-12 LAB — SEX HORMONE BINDING GLOBULIN: Sex Hormone Binding: 216 nmol/L — ABNORMAL HIGH (ref 17–124)

## 2016-01-14 LAB — GROWTH HORMONE: Growth Hormone: 1.5 ng/mL (ref <=7.1)

## 2016-01-15 LAB — THYROID STIMULATING IMMUNOGLOBULIN: TSI: <89 % baseline (ref <140)

## 2016-01-15 LAB — INSULIN-LIKE GROWTH FACTOR
IGF-I, LC/MS: 193 ng/mL (ref 83–456)
Z-SCORE (FEMALE): -0.4 {STDV} (ref ?–2.0)

## 2016-01-16 LAB — FREE T3 BY DIALYSIS WITH T3 TOTAL
Free T3: 291 pg/dL (ref 210–440)
T3, Total: 151 ng/dL (ref 76–181)

## 2016-01-16 LAB — DIRECT DIALYSIS FREE T4: T4, Free Direct Dialysis: 2.6 ng/dL (ref 0.8–2.7)

## 2016-01-17 ENCOUNTER — Encounter: Payer: Self-pay | Admitting: Internal Medicine

## 2016-01-17 LAB — ALPHA SUBUNIT (FREE): Alpha Subunit (Free): 0.21 ng/mL

## 2016-11-12 ENCOUNTER — Telehealth: Payer: Self-pay | Admitting: Internal Medicine

## 2016-11-12 NOTE — Telephone Encounter (Signed)
° °  Pt call to ask if Dr Fabian SharpPanosh will rx her the following med until she can get in to see her Dermatologist for the rx that she gets for her agne   [facility]   [address]

## 2016-11-13 NOTE — Telephone Encounter (Signed)
Please advise 

## 2016-11-13 NOTE — Telephone Encounter (Signed)
Pt states dermatology office will not fill it since she has not been seen for a while I there office. Pt is schedule to see Dr. Fabian SharpPanosh 11/15/2015 at 9:45am

## 2016-11-13 NOTE — Progress Notes (Signed)
Chief Complaint  Patient presents with  . Medication Management    HPI: Vickie Mata 21 y.o.  sda  last seen over a year ago request medicine given to her by dermatologist.Dr Haverstock  Who cannot see her utnil August . She has had  Her acne well controlled woth  orthocept but ran out a month ago and  Acne flaring mid to lower face chin area .   No topicals current ly  Graduated from [place] and to work [employer] it  [place] in October.   thryoid has been nl  No change  No change in health issues injury etc.  ROS: See pertinent positives and negatives per HPI.  Past Medical History:  Diagnosis Date  . Acne   . Allergy   . Asthma   . Fatigue   . Goiter   . Hashimoto's thyroiditis   . Hyperthyroidism   . Hypothyroidism   . Otitis externa 12/09/2011    Family History  Problem Relation Age of Onset  . Thyroid disease Mother        Borderline TFTs  . Diabetes Maternal Grandfather   . Diabetes Paternal Grandfather   . Cancer Neg Hx   . Heart disease Neg Hx   . Stroke Neg Hx     Social History   Social History  . Marital status: Single    Spouse name: N/A  . Number of children: N/A  . Years of education: N/A   Social History Main Topics  . Smoking status: Never Smoker  . Smokeless tobacco: Never Used  . Alcohol use No  . Drug use: No  . Sexual activity: Not Asked   Other Topics Concern  . None   Social History Narrative   HH of 6 no ETS   Swims   [employer] and track   [place] originally    [employer]    [employer]    To move to [employer] job    Outpatient Medications Prior to Visit  Medication Sig Dispense Refill  . norgestimate-ethinyl estradiol (ORTHO-CYCLEN,SPRINTEC,PREVIFEM) 0.25-35 MG-MCG tablet Take 1 tablet by mouth daily. 1 Package 11   No facility-administered medications prior to visit.      EXAM:  BP 100/80 (BP Location: Right Arm, Patient Position: Sitting, Cuff Size: Normal)   Pulse 72   Temp 97.7 F (36.5 C) (Oral)    Ht 5' 9.25" (1.759 m)   Wt 160 lb 6.4 oz (72.8 kg)   BMI 23.52 kg/m   Body mass index is 23.52 kg/m.  GENERAL: vitals reviewed and listed above, alert, oriented, appears well hydrated and in no acute distress HEENT: atraumatic, conjunctiva  clear, no obvious abnormalities on inspection of external nose and ears NECK:  goiter noted no pain Skin: normal capillary refill ,turgor , color: No acute rashes ,petechiae or bruising acne some inflammatory lesion 4 on lower jaw line and  Chin  Back and chest clear  PSYCH: pleasant and cooperative, no obvious depression or anxiety  ASSESSMENT AND PLAN:  Discussed the following assessment and plan:  Acne vulgaris - restart ocp and add retinoid or adapalene for now until remitted  Medication management - benefit more than risk of meds   Need for diphtheria-tetanus-pertussis (Tdap) vaccine - Plan: Tdap vaccine greater than or equal to 7yo IM  Goiter advised see endo  before  Moves to [place].  -Patient advised to return or notify health care team  if symptoms worsen ,persist or new concerns arise.  Patient Instructions  Go back on ocps   Add  Topical night ly adapalene to effected area until remitted .  tdap update today .  Get appt with dr Reece AgarG about thyroid plan before you leave .  For [place].     Neta MendsWanda K. Panosh M.D.

## 2016-11-13 NOTE — Telephone Encounter (Signed)
Last OV with me  was over a year ago . I advise she  Ask derm office to fill enough to get her to the next appt. The prescribing  Provider  Best to decide on this .  If not , would need Ov to discuss meds  And rx If I write a rx ,

## 2016-11-14 ENCOUNTER — Ambulatory Visit (INDEPENDENT_AMBULATORY_CARE_PROVIDER_SITE_OTHER): Payer: Self-pay | Admitting: Internal Medicine

## 2016-11-14 ENCOUNTER — Encounter: Payer: Self-pay | Admitting: Internal Medicine

## 2016-11-14 VITALS — BP 100/80 | HR 72 | Temp 97.7°F | Ht 69.25 in | Wt 160.4 lb

## 2016-11-14 DIAGNOSIS — Z79899 Other long term (current) drug therapy: Secondary | ICD-10-CM

## 2016-11-14 DIAGNOSIS — L700 Acne vulgaris: Secondary | ICD-10-CM

## 2016-11-14 DIAGNOSIS — E049 Nontoxic goiter, unspecified: Secondary | ICD-10-CM

## 2016-11-14 DIAGNOSIS — Z23 Encounter for immunization: Secondary | ICD-10-CM

## 2016-11-14 MED ORDER — NORGESTIMATE-ETH ESTRADIOL 0.25-35 MG-MCG PO TABS: 1.0000 | ORAL_TABLET | Freq: Every day | ORAL | 11 refills | Status: AC

## 2016-11-14 MED ORDER — ADAPALENE 0.1 % EX CREA: TOPICAL_CREAM | Freq: Every day | CUTANEOUS | 2 refills | Status: AC

## 2016-11-14 NOTE — Patient Instructions (Signed)
Go back on ocps   Add  Topical night ly adapalene to effected area until remitted .  tdap update today .  Get appt with dr Reece AgarG about thyroid plan before you leave .  For [place].
